# Patient Record
Sex: Female | Born: 1995 | Race: White | Hispanic: No | Marital: Single | Smoking: Never smoker
Health system: Southern US, Community
[De-identification: ages and names within clinical notes are randomized; demographics above are authoritative.]

---
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
## 2014-09-04 IMAGING — CR DG CHEST 2V
1 series · 2 of 2 positions shown · non-contrast
Comparison: None.

CLINICAL DATA: Cough, fever

EXAM:
CHEST  2 VIEW

[Series 1: w chest pa · 0.14mm/px · 2 of 2 slices shown]
[im 1/2]
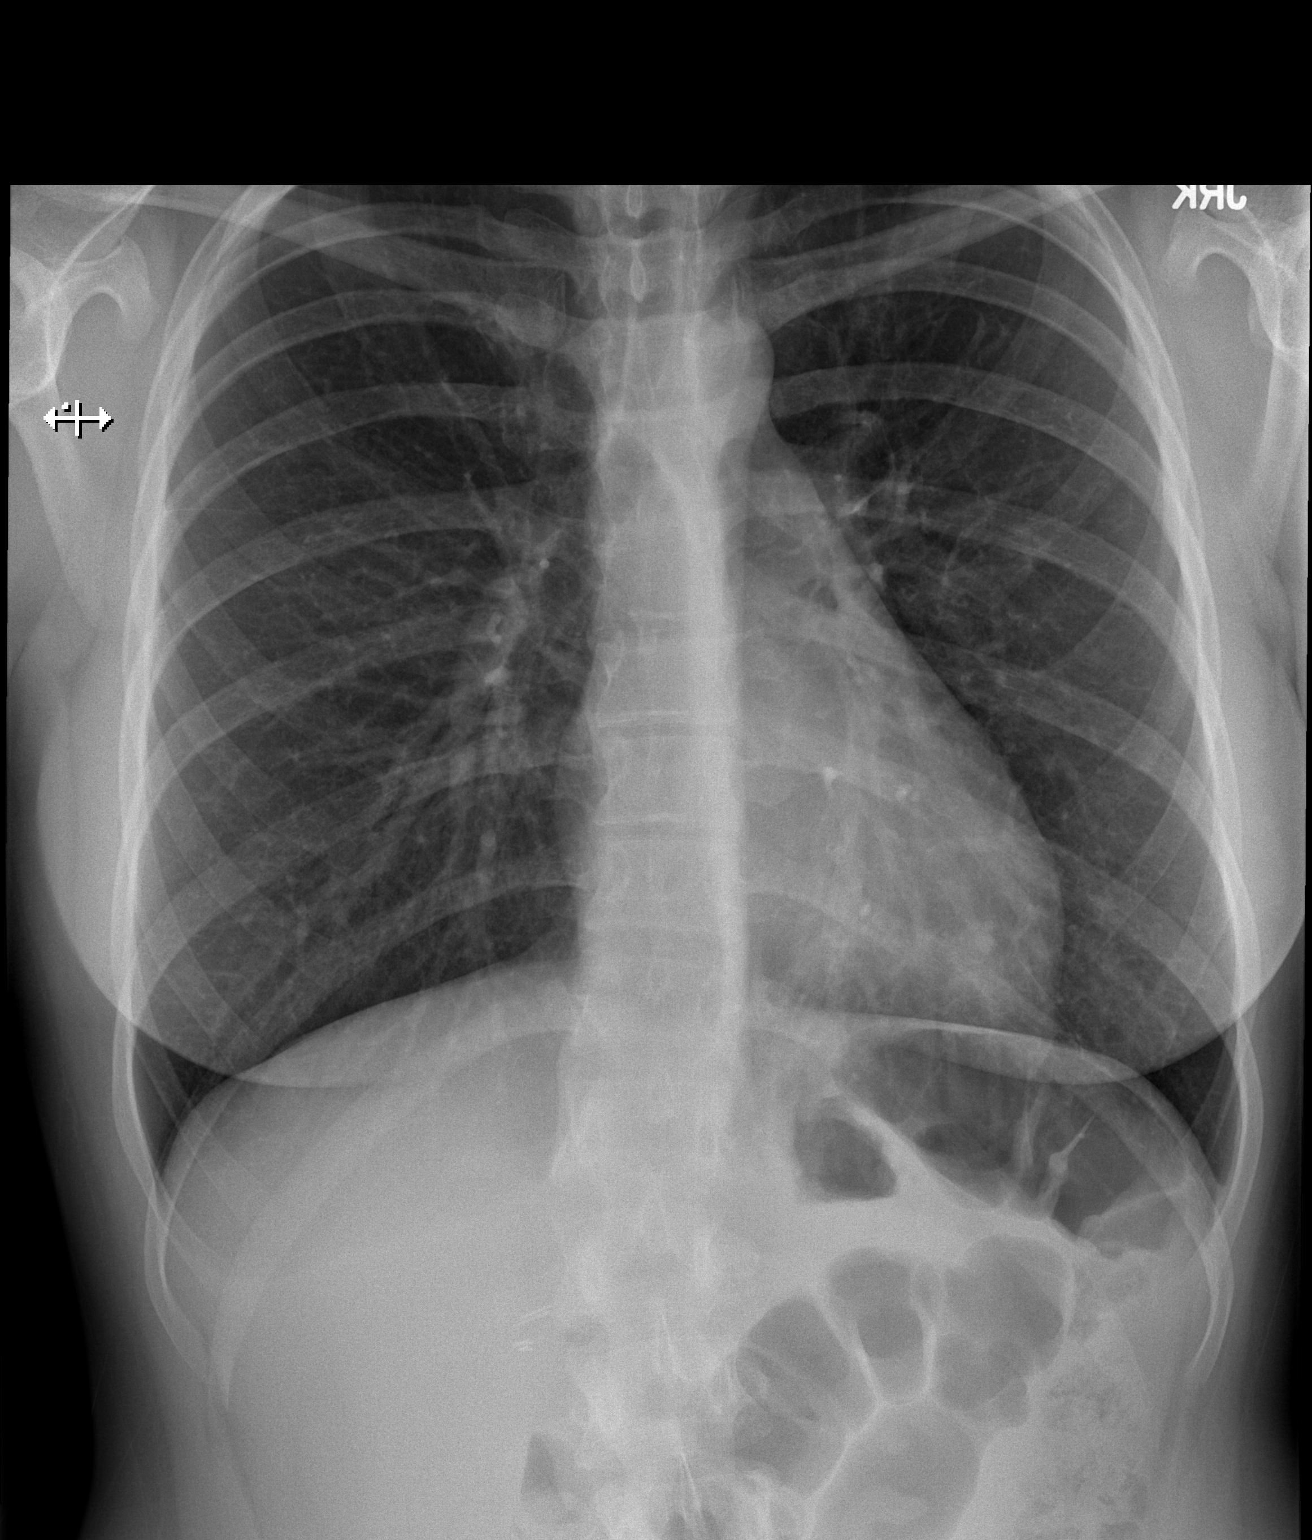
[im 2/2]
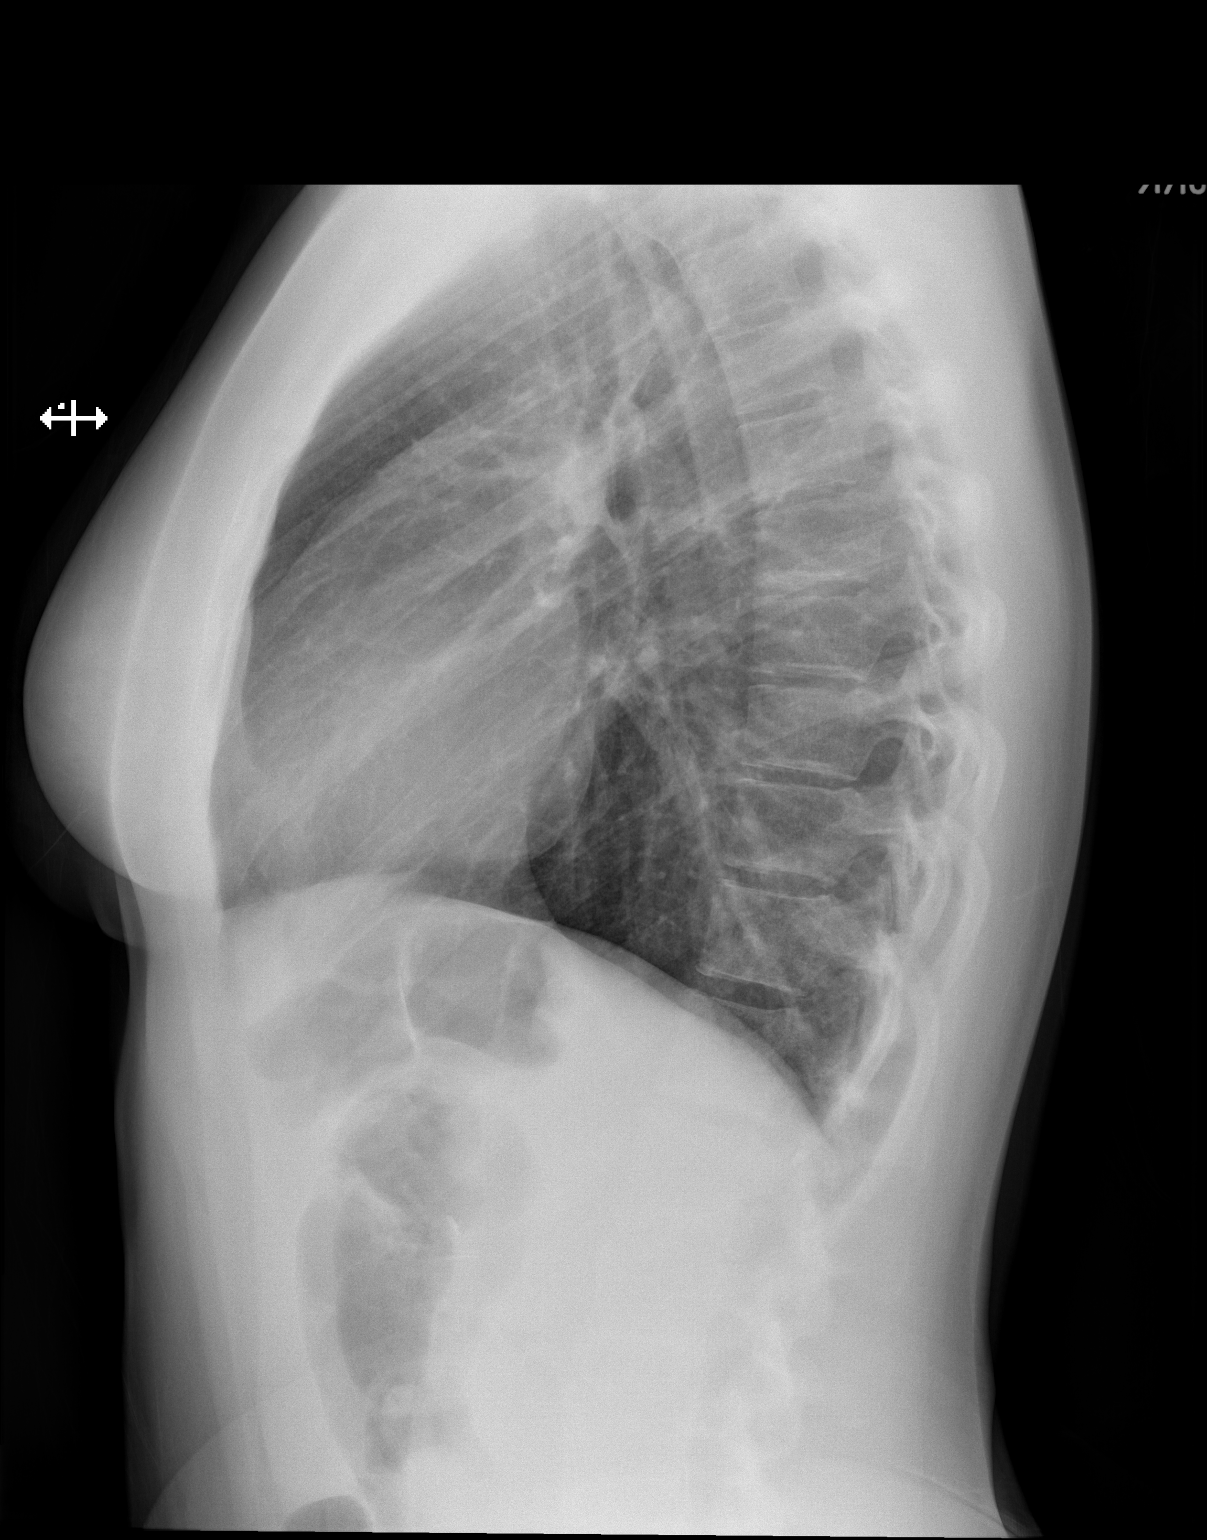

[2 of 2 positions shown; findings below may reference images not displayed]

FINDINGS: Cardiomediastinal contours within normal range. Lungs are clear. No
pleural effusion or pneumothorax. No acute osseous finding.
IMPRESSION: No radiographic evidence for active cardiopulmonary disease.

## 2016-06-21 ENCOUNTER — Encounter: Payer: Self-pay | Admitting: *Deleted

## 2016-06-21 DIAGNOSIS — R509 Fever, unspecified: Secondary | ICD-10-CM

## 2016-06-21 DIAGNOSIS — B349 Viral infection, unspecified: Secondary | ICD-10-CM | POA: Diagnosis not present

## 2016-06-21 DIAGNOSIS — R51 Headache: Secondary | ICD-10-CM | POA: Insufficient documentation

## 2016-06-21 LAB — URINALYSIS, COMPLETE (UACMP) WITH MICROSCOPIC
BILIRUBIN URINE: NEGATIVE
GLUCOSE, UA: NEGATIVE mg/dL
KETONES UR: NEGATIVE mg/dL
NITRITE: NEGATIVE
PH: 7 (ref 5.0–8.0)
PROTEIN: NEGATIVE mg/dL
RBC / HPF: NONE SEEN RBC/hpf (ref 0–5)
Specific Gravity, Urine: 1.001 — ABNORMAL LOW (ref 1.005–1.030)

## 2016-06-21 LAB — CBC
HCT: 36.3 % (ref 35.0–47.0)
Hemoglobin: 12.7 g/dL (ref 12.0–16.0)
MCH: 31 pg (ref 26.0–34.0)
MCHC: 35.1 g/dL (ref 32.0–36.0)
MCV: 88.3 fL (ref 80.0–100.0)
PLATELETS: 258 10*3/uL (ref 150–440)
RBC: 4.11 MIL/uL (ref 3.80–5.20)
RDW: 12.8 % (ref 11.5–14.5)
WBC: 4.3 10*3/uL (ref 3.6–11.0)

## 2016-06-21 LAB — POCT PREGNANCY, URINE: Preg Test, Ur: NEGATIVE

## 2016-06-21 LAB — BASIC METABOLIC PANEL
ANION GAP: 7 (ref 5–15)
BUN: 9 mg/dL (ref 6–20)
CHLORIDE: 106 mmol/L (ref 101–111)
CO2: 24 mmol/L (ref 22–32)
Calcium: 9.3 mg/dL (ref 8.9–10.3)
Creatinine, Ser: 0.67 mg/dL (ref 0.44–1.00)
GFR calc non Af Amer: >60 mL/min (ref >60)
Glucose, Bld: 104 mg/dL — ABNORMAL HIGH (ref 65–99)
Potassium: 3.5 mmol/L (ref 3.5–5.1)
Sodium: 137 mmol/L (ref 135–145)

## 2016-06-21 NOTE — ED Triage Notes (Addendum)
Pt ambulatory to triage.  Pt reports a fever today and states she has a blood disorder. Pt requesting bloodwork tonight.  Pt has hereditary spherocytosis.  Pt states she awakened today with a headache and fever.  No cough . Pt alert.

## 2016-06-22 ENCOUNTER — Emergency Department
Admission: EM | Admit: 2016-06-22 | Discharge: 2016-06-22 | Disposition: A | Discharge status: Home / Self Care | Payer: Managed Care, Other (non HMO) | Attending: Emergency Medicine | Admitting: Emergency Medicine

## 2016-06-22 ENCOUNTER — Emergency Department
Admission: EM | Admit: 2016-06-22 | Discharge: 2016-06-22 | Disposition: A | Discharge status: Home / Self Care | Payer: Managed Care, Other (non HMO) | Source: Home / Self Care | Attending: Emergency Medicine | Admitting: Emergency Medicine

## 2016-06-22 ENCOUNTER — Emergency Department: Payer: Managed Care, Other (non HMO)

## 2016-06-22 DIAGNOSIS — B349 Viral infection, unspecified: Secondary | ICD-10-CM

## 2016-06-22 DIAGNOSIS — R509 Fever, unspecified: Secondary | ICD-10-CM

## 2016-06-22 LAB — HEPATIC FUNCTION PANEL
ALT: 15 U/L (ref 14–54)
AST: 24 U/L (ref 15–41)
Albumin: 3.8 g/dL (ref 3.5–5.0)
Alkaline Phosphatase: 49 U/L (ref 38–126)
Total Bilirubin: 0.3 mg/dL (ref 0.3–1.2)
Total Protein: 6.9 g/dL (ref 6.5–8.1)

## 2016-06-22 LAB — CBC WITH DIFFERENTIAL/PLATELET
BASOS PCT: 1 %
Basophils Absolute: 0 10*3/uL (ref 0–0.1)
Eosinophils Absolute: 0 10*3/uL (ref 0–0.7)
Eosinophils Relative: 0 %
HEMATOCRIT: 35 % (ref 35.0–47.0)
Hemoglobin: 12.6 g/dL (ref 12.0–16.0)
LYMPHS ABS: 0.4 10*3/uL — AB (ref 1.0–3.6)
LYMPHS PCT: 15 %
MCH: 31.7 pg (ref 26.0–34.0)
MCHC: 36 g/dL (ref 32.0–36.0)
MCV: 88 fL (ref 80.0–100.0)
MONO ABS: 0.4 10*3/uL (ref 0.2–0.9)
Monocytes Relative: 14 %
NEUTROS ABS: 1.8 10*3/uL (ref 1.4–6.5)
Neutrophils Relative %: 70 %
Platelets: 222 10*3/uL (ref 150–440)
RBC: 3.97 MIL/uL (ref 3.80–5.20)
RDW: 12.8 % (ref 11.5–14.5)
WBC: 2.5 10*3/uL — ABNORMAL LOW (ref 3.6–11.0)

## 2016-06-22 LAB — POCT RAPID STREP A: Streptococcus, Group A Screen (Direct): NEGATIVE

## 2016-06-22 LAB — MONONUCLEOSIS SCREEN: MONO SCREEN: NEGATIVE

## 2016-06-22 LAB — INFLUENZA PANEL BY PCR (TYPE A & B)
Influenza A By PCR: NEGATIVE
Influenza B By PCR: NEGATIVE

## 2016-06-22 MED ORDER — SODIUM CHLORIDE 0.9 % IV BOLUS (SEPSIS)
1000.0000 mL | Freq: Once | INTRAVENOUS | Status: AC
  Administered 2016-06-22: 1000 mL via INTRAVENOUS

## 2016-06-22 MED ORDER — LEVOFLOXACIN 500 MG PO TABS: 500.0000 mg | ORAL_TABLET | Freq: Every day | ORAL | 0 refills | Status: AC

## 2016-06-22 NOTE — ED Notes (Signed)
Patient reports having a fever that started yesterday. Has been taking Tylenol at home and reports fever keeps getting worse

## 2016-06-22 NOTE — ED Notes (Signed)
ED Provider at bedside. 

## 2016-06-22 NOTE — ED Provider Notes (Signed)
[facility] Emergency Department Provider Note   First MD Initiated Contact with Patient 06/22/16 0015     (approximate)  I have reviewed the triage vital signs and the nursing notes.   HISTORY  Chief Complaint Fever   HPI Vanessa Wright is a 21 y.o. female with history of hereditary spherocytosis presents to the emergency department with acute onset of fever today. Patient states her temperature at home was 102. Patient states "I have a mild headache". Patient denies any other symptoms no cough vomiting diarrhea. Patient denies any dysuria.   Past medical history Hereditary spherocytosis There are no active problems to display for this patient.  Past surgical history None  Prior to Admission medications   Not on File    Allergies Biaxin [clarithromycin] and Cefzil [cefprozil]  No family history on file.  Social History Social History  Substance Use Topics  . Smoking status: Never Smoker  . Smokeless tobacco: Never Used  . Alcohol use No    Review of Systems Constitutional: Positive for fever/chills Eyes: No visual changes. ENT: No sore throat. Cardiovascular: Denies chest pain. Respiratory: Denies shortness of breath. Gastrointestinal: No abdominal pain.  No nausea, no vomiting.  No diarrhea.  No constipation. Genitourinary: Negative for dysuria. Musculoskeletal: Negative for back pain. Skin: Negative for rash. Neurological: Negative for headaches, focal weakness or numbness.  10-point ROS otherwise negative.  ____________________________________________   PHYSICAL EXAM:  VITAL SIGNS: ED Triage Vitals  Enc Vitals Group     BP 06/21/16 2313 (!) 148/79     Pulse Rate 06/21/16 2313 (!) 119     Resp 06/21/16 2313 20     Temp 06/21/16 2313 (!) 100.8 F (38.2 C)     Temp Source 06/21/16 2313 Oral     SpO2 06/21/16 2313 100 %     Weight 06/21/16 2314 138 lb (62.6 kg)     Height 06/21/16 2314 5\' 5"  (1.651 m)     Head  Circumference --      Peak Flow --      Pain Score 06/21/16 2314 4     Pain Loc --      Pain Edu? --      Excl. in GC? --     Constitutional: Alert and oriented. Well appearing and in no acute distress. Eyes: Conjunctivae are normal. PERRL. EOMI. Head: Atraumatic. Nose: No congestion/rhinnorhea. Mouth/Throat: Mucous membranes are moist.  Oropharynx non-erythematous. Neck: No stridor.  No meningeal signs.   Cardiovascular: Normal rate, regular rhythm. Good peripheral circulation. Grossly normal heart sounds. Respiratory: Normal respiratory effort.  No retractions. Lungs CTAB. Gastrointestinal: Soft and nontender. No distention.  Musculoskeletal: No lower extremity tenderness nor edema. No gross deformities of extremities. Neurologic:  Normal speech and language. No gross focal neurologic deficits are appreciated.  Skin:  Skin is warm, dry and intact. No rash noted. Psychiatric: Mood and affect are normal. Speech and behavior are normal.  ____________________________________________   LABS (all labs ordered are listed, but only abnormal results are displayed)  Labs Reviewed  BASIC METABOLIC PANEL - Abnormal; Notable for the following:       Result Value   Glucose, Bld 104 (*)    All other components within normal limits  URINALYSIS, COMPLETE (UACMP) WITH MICROSCOPIC - Abnormal; Notable for the following:    Color, Urine COLORLESS (*)    APPearance CLEAR (*)    Specific Gravity, Urine 1.001 (*)    Hgb urine dipstick SMALL (*)    Leukocytes, UA  TRACE (*)    Bacteria, UA RARE (*)    Squamous Epithelial / LPF 0-5 (*)    All other components within normal limits  CBC  INFLUENZA PANEL BY PCR (TYPE A & B)  MONONUCLEOSIS SCREEN  POCT PREGNANCY, URINE     Procedures     INITIAL IMPRESSION / ASSESSMENT AND PLAN / ED COURSE  Pertinent labs & imaging results that were available during my care of the patient were reviewed by me and considered in my medical decision making (see  chart for details).  21 year old female presenting to the emergency department with fever and a mild headache. Laboratory data unremarkable including CBC, influenza, mono and urinalysis. Suspect possible viral etiology for the patient's febrile illness. Patient received 1 L IV normal saline with resolution of fever and tachycardia. Patient advised of warning signs which would warrant return to the emergency department.      ____________________________________________  FINAL CLINICAL IMPRESSION(S) / ED DIAGNOSES  Final diagnoses:  Fever, unspecified fever cause     MEDICATIONS GIVEN DURING THIS VISIT:  Medications  sodium chloride 0.9 % bolus 1,000 mL (0 mLs Intravenous Stopped 06/22/16 0204)     NEW OUTPATIENT MEDICATIONS STARTED DURING THIS VISIT:  New Prescriptions   No medications on file    Modified Medications   No medications on file    Discontinued Medications   No medications on file     Note:  This document was prepared using Dragon voice recognition software and may include unintentional dictation errors.    Darci Current, MD 06/22/16 [phone]

## 2016-06-22 NOTE — ED Triage Notes (Signed)
Pt states that she was seen a couple hours ago for same, states that her fever has gotten worse, pt states that she has a disorder that when she gets a fever her bilirubin and hemoglobin have to be checked

## 2016-06-22 NOTE — ED Provider Notes (Signed)
Time Seen: Approximately 0811  I have reviewed the triage notes  Chief Complaint: Fever   History of Present Illness: Vanessa Wright is a 21 y.o. female *who has a history of hereditary spherocytosis. Patient developed fever and was here last evening with low-grade fever. She had an extensive workup which showed a normal hemoglobin and no evidence clinically of hemolysis. Patient states she went home and took some ibuprofen and her temperature actually went back up to greater than 102. She arrives this morning at 101.1. She has no other new symptoms. She states she has some generalized lightheadedness and fatigue but otherwise feels fine. She denies any significant headaches neck stiffness photophobia, rash or any other concerns. No recent travel.   No past medical history on file.  There are no active problems to display for this patient.   No past surgical history on file.  No past surgical history on file.    Allergies:  Biaxin [clarithromycin] and Cefzil [cefprozil]  Family History: No family history on file.  Social History: Social History  Substance Use Topics  . Smoking status: Never Smoker  . Smokeless tobacco: Never Used  . Alcohol use No     Review of Systems:   10 point review of systems was performed and was otherwise negative:  Constitutional: No fever Eyes: No visual disturbances ENT: No sore throat, ear pain Cardiac: No chest pain Respiratory: No shortness of breath, wheezing, or stridor Abdomen: No abdominal pain, no vomiting, No diarrhea Endocrine: No weight loss, No night sweats Extremities: No peripheral edema, cyanosis Skin: No rashes, easy bruising Neurologic: No focal weakness, trouble with speech or swollowing Urologic: No dysuria, Hematuria, or urinary frequency No vaginal discharge or bleeding  Physical Exam:  ED Triage Vitals  Enc Vitals Group     BP 06/22/16 0716 133/82     Pulse Rate 06/22/16 0716 (!) 114     Resp 06/22/16  0716 20     Temp 06/22/16 0716 (!) 101.1 F (38.4 C)     Temp Source 06/22/16 0716 Oral     SpO2 06/22/16 0716 95 %     Weight 06/22/16 0712 138 lb (62.6 kg)     Height 06/22/16 0712 5\' 5"  (1.651 m)     Head Circumference --      Peak Flow --      Pain Score 06/22/16 0712 4     Pain Loc --      Pain Edu? --      Excl. in GC? --     General: Awake , Alert , and Oriented times 3; GCS 15 Head: Normal cephalic , atraumatic Eyes: Pupils equal , round, reactive to light Nose/Throat: No nasal drainage, patent upper airway without erythema or exudate.  Neck: Supple, Full range of motion, No anterior adenopathy or palpable thyroid masses Lungs: Clear to ascultation without wheezes , rhonchi, or rales Heart: Regular rate, regular rhythm without murmurs , gallops , or rubs Abdomen: Soft, non tender without rebound, guarding , or rigidity; bowel sounds positive and symmetric in all 4 quadrants. No organomegaly .        Extremities: 2 plus symmetric pulses. No edema, clubbing or cyanosis Neurologic: normal ambulation, Motor symmetric without deficits, sensory intact Skin: warm, dry, no rashes   Labs:   All laboratory work was reviewed including any pertinent negatives or positives listed below:  Labs Reviewed  CULTURE, BLOOD (ROUTINE X 2)  CULTURE, BLOOD (ROUTINE X 2)  HEPATIC FUNCTION PANEL  CBC  WITH DIFFERENTIAL/PLATELET  Patient's hemoglobin and bilirubin appear to be stable   Radiology: "Dg Chest 2 View  Result Date: 06/22/2016 CLINICAL DATA:  Fever EXAM: CHEST  2 VIEW COMPARISON:  July 05, 2013 FINDINGS: Lungs are clear. Heart size and pulmonary vascularity are normal. No adenopathy. No bone lesions. IMPRESSION: No edema or consolidation. Electronically Signed   By: Bretta BangWilliam  Woodruff III M.D.   On: 06/22/2016 08:33  "  I personally reviewed the radiologic studies    ED Course: * The patient's stay here was uneventful and her previous laboratory work was reviewed. I added  further fever workup including a strep test and repeated the CBC and added hepatic enzymes which all appear to be within normal limits. She also had blood cultures 2 and was given additional liter of fluid and felt symptomatic relief. The patient does not appear to have a bacterial infection at this time though blood cultures are pending. 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 She was advised continue with multivitamins including folic acid, etc. [facility] to a hematologist. Did prescribe her Levaquin prophylactically case the  fever persist.     Assessment: *  viral syndrome   hereditary spherocytosis     Plan:  Outpatient " New Prescriptions   LEVOFLOXACIN (LEVAQUIN) 500 MG TABLET    Take 1 tablet (500 mg total) by mouth daily.  " Patient was advised to return immediately if condition worsens. Patient was advised to follow up with their primary care physician or other specialized physicians involved in their outpatient care. The patient and/or family member/power of attorney had laboratory results reviewed at the bedside. All questions and concerns were addressed and appropriate discharge instructions were distributed by the nursing staff.             Jennye MoccasinBrian S Dorin Stooksbury, MD 06/22/16 1048

## 2016-06-22 NOTE — Discharge Instructions (Signed)
Please continue with fever control with either ibuprofen or Tylenol. Continue drinking plenty of fluids and take multivitamins especially folic acid and B vitamins. Follow up with the Family clinic. You may require local referral to hematology.  Blood cultures are pending at this time.  Please return immediately if condition worsens. Please contact her primary physician or the physician you were given for referral. If you have any specialist physicians involved in her treatment and plan please also contact them. Thank you for using [facility].

## 2016-06-24 LAB — CULTURE, GROUP A STREP (THRC)

## 2016-06-27 LAB — CULTURE, BLOOD (ROUTINE X 2)
Culture: NO GROWTH
Culture: NO GROWTH
